# Patient Record
Sex: Male | Born: 1990 | Race: Black or African American | Hispanic: No | Marital: Single | Smoking: Never smoker
Health system: Southern US, Community
[De-identification: ages and names within clinical notes are randomized; demographics above are authoritative.]

## PROBLEM LIST (undated history)

## (undated) DIAGNOSIS — J45909 Unspecified asthma, uncomplicated: Secondary | ICD-10-CM

---
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
## 2011-01-03 ENCOUNTER — Emergency Department: Payer: Self-pay | Admitting: Unknown Physician Specialty

## 2014-02-15 ENCOUNTER — Emergency Department (HOSPITAL_COMMUNITY): Payer: No Typology Code available for payment source

## 2014-02-15 ENCOUNTER — Encounter (HOSPITAL_COMMUNITY): Payer: Self-pay | Admitting: *Deleted

## 2014-02-15 ENCOUNTER — Emergency Department (HOSPITAL_COMMUNITY)
Admission: EM | Admit: 2014-02-15 | Discharge: 2014-02-15 | Disposition: A | Discharge status: Home / Self Care | Payer: No Typology Code available for payment source | Attending: Emergency Medicine | Admitting: Emergency Medicine

## 2014-02-15 DIAGNOSIS — Z880 Allergy status to penicillin: Secondary | ICD-10-CM | POA: Insufficient documentation

## 2014-02-15 DIAGNOSIS — Y998 Other external cause status: Secondary | ICD-10-CM | POA: Insufficient documentation

## 2014-02-15 DIAGNOSIS — S59902A Unspecified injury of left elbow, initial encounter: Secondary | ICD-10-CM | POA: Insufficient documentation

## 2014-02-15 DIAGNOSIS — Y9389 Activity, other specified: Secondary | ICD-10-CM | POA: Insufficient documentation

## 2014-02-15 DIAGNOSIS — M25522 Pain in left elbow: Secondary | ICD-10-CM

## 2014-02-15 DIAGNOSIS — Z043 Encounter for examination and observation following other accident: Secondary | ICD-10-CM

## 2014-02-15 DIAGNOSIS — Z041 Encounter for examination and observation following transport accident: Secondary | ICD-10-CM

## 2014-02-15 DIAGNOSIS — Y92410 Unspecified street and highway as the place of occurrence of the external cause: Secondary | ICD-10-CM | POA: Insufficient documentation

## 2014-02-15 MED ORDER — IBUPROFEN 600 MG PO TABS: 600.0000 mg | ORAL_TABLET | Freq: Four times a day (QID) | ORAL | Status: AC | PRN

## 2014-02-15 MED ORDER — HYDROCODONE-ACETAMINOPHEN 5-325 MG PO TABS: 1.0000 | ORAL_TABLET | ORAL | Status: AC | PRN

## 2014-02-15 MED ORDER — IBUPROFEN 400 MG PO TABS
600.0000 mg | ORAL_TABLET | Freq: Once | ORAL | Status: AC
  Administered 2014-02-15: 600 mg via ORAL
  Filled 2014-02-15 (×2): qty 1

## 2014-02-15 MED ORDER — HYDROCODONE-ACETAMINOPHEN 5-325 MG PO TABS
2.0000 | ORAL_TABLET | Freq: Once | ORAL | Status: AC
  Administered 2014-02-15: 2 via ORAL
  Filled 2014-02-15: qty 2
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
## 2014-02-15 NOTE — ED Notes (Signed)
Declined W/C at D/C and was escorted to lobby by RN. 
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
## 2015-10-04 ENCOUNTER — Encounter (HOSPITAL_COMMUNITY): Payer: Self-pay

## 2015-10-04 ENCOUNTER — Emergency Department (HOSPITAL_COMMUNITY)
Admission: EM | Admit: 2015-10-04 | Discharge: 2015-10-04 | Disposition: A | Discharge status: Home / Self Care | Payer: No Typology Code available for payment source | Attending: Emergency Medicine | Admitting: Emergency Medicine

## 2015-10-04 ENCOUNTER — Emergency Department (HOSPITAL_COMMUNITY): Payer: No Typology Code available for payment source

## 2015-10-04 DIAGNOSIS — Y939 Activity, unspecified: Secondary | ICD-10-CM | POA: Insufficient documentation

## 2015-10-04 DIAGNOSIS — Y999 Unspecified external cause status: Secondary | ICD-10-CM | POA: Insufficient documentation

## 2015-10-04 DIAGNOSIS — J45909 Unspecified asthma, uncomplicated: Secondary | ICD-10-CM | POA: Insufficient documentation

## 2015-10-04 DIAGNOSIS — Z91010 Allergy to peanuts: Secondary | ICD-10-CM | POA: Diagnosis not present

## 2015-10-04 DIAGNOSIS — M25512 Pain in left shoulder: Secondary | ICD-10-CM | POA: Insufficient documentation

## 2015-10-04 DIAGNOSIS — Z5181 Encounter for therapeutic drug level monitoring: Secondary | ICD-10-CM | POA: Insufficient documentation

## 2015-10-04 DIAGNOSIS — R079 Chest pain, unspecified: Secondary | ICD-10-CM | POA: Diagnosis not present

## 2015-10-04 DIAGNOSIS — R109 Unspecified abdominal pain: Secondary | ICD-10-CM | POA: Diagnosis not present

## 2015-10-04 DIAGNOSIS — Y92410 Unspecified street and highway as the place of occurrence of the external cause: Secondary | ICD-10-CM | POA: Diagnosis not present

## 2015-10-04 DIAGNOSIS — Z79899 Other long term (current) drug therapy: Secondary | ICD-10-CM | POA: Diagnosis not present

## 2015-10-04 DIAGNOSIS — R55 Syncope and collapse: Secondary | ICD-10-CM | POA: Insufficient documentation

## 2015-10-04 HISTORY — DX: Unspecified asthma, uncomplicated: J45.909

## 2015-10-04 LAB — CBC
HCT: 41.6 % (ref 39.0–52.0)
Hemoglobin: 13.9 g/dL (ref 13.0–17.0)
MCH: 29.3 pg (ref 26.0–34.0)
MCHC: 33.4 g/dL (ref 30.0–36.0)
MCV: 87.6 fL (ref 78.0–100.0)
Platelets: 271 10*3/uL (ref 150–400)
RBC: 4.75 MIL/uL (ref 4.22–5.81)
RDW: 12.9 % (ref 11.5–15.5)
WBC: 6.9 10*3/uL (ref 4.0–10.5)

## 2015-10-04 LAB — COMPREHENSIVE METABOLIC PANEL
ALT: 15 U/L — ABNORMAL LOW (ref 17–63)
AST: 20 U/L (ref 15–41)
Albumin: 4.5 g/dL (ref 3.5–5.0)
Alkaline Phosphatase: 50 U/L (ref 38–126)
Anion gap: 8 (ref 5–15)
BILIRUBIN TOTAL: 1.4 mg/dL — AB (ref 0.3–1.2)
BUN: 11 mg/dL (ref 6–20)
CO2: 23 mmol/L (ref 22–32)
CREATININE: 0.88 mg/dL (ref 0.61–1.24)
Calcium: 9.5 mg/dL (ref 8.9–10.3)
Chloride: 108 mmol/L (ref 101–111)
GFR calc Af Amer: >60 mL/min (ref >60)
Glucose, Bld: 116 mg/dL — ABNORMAL HIGH (ref 65–99)
Potassium: 3.7 mmol/L (ref 3.5–5.1)
Sodium: 139 mmol/L (ref 135–145)
Total Protein: 7 g/dL (ref 6.5–8.1)

## 2015-10-04 LAB — PROTIME-INR
INR: 1.08
Prothrombin Time: 14 seconds (ref 11.4–15.2)

## 2015-10-04 LAB — ETHANOL: Alcohol, Ethyl (B): <5 mg/dL (ref <5)

## 2015-10-04 LAB — I-STAT CG4 LACTIC ACID, ED: LACTIC ACID, VENOUS: 1.43 mmol/L (ref 0.5–1.9)

## 2015-10-04 MED ORDER — HYDROMORPHONE HCL 1 MG/ML IJ SOLN
1.0000 mg | Freq: Once | INTRAMUSCULAR | Status: AC
  Administered 2015-10-04: 1 mg via INTRAVENOUS
  Filled 2015-10-04: qty 1

## 2015-10-04 MED ORDER — SODIUM CHLORIDE 0.9 % IV BOLUS (SEPSIS)
1000.0000 mL | Freq: Once | INTRAVENOUS | Status: AC
  Administered 2015-10-04: 1000 mL via INTRAVENOUS

## 2015-10-04 MED ORDER — IBUPROFEN 400 MG PO TABS
600.0000 mg | ORAL_TABLET | Freq: Once | ORAL | Status: AC
  Administered 2015-10-04: 600 mg via ORAL
  Filled 2015-10-04: qty 1

## 2015-10-04 MED ORDER — IOPAMIDOL (ISOVUE-300) INJECTION 61%
INTRAVENOUS | Status: AC
  Administered 2015-10-04: 100 mL
  Filled 2015-10-04: qty 100

## 2015-10-04 MED ORDER — SODIUM CHLORIDE 0.9 % IV SOLN: INTRAVENOUS | Status: DC

## 2015-10-04 NOTE — ED Notes (Signed)
Patient transported to CT 

## 2015-10-04 NOTE — ED Notes (Signed)
MD at bedside. 

## 2015-10-04 NOTE — Progress Notes (Signed)
Orthopedic Tech Progress Note Patient Details:  Kenneth Mcgee [birth year] [id number]  Ortho Devices Type of Ortho Device: Arm sling Ortho Device/Splint Location: lue Ortho Device/Splint Interventions: Ordered, Application   [name], [name] 10/04/2015, 8:25 PM

## 2015-10-04 NOTE — Discharge Instructions (Signed)
If you were given medicines take as directed.  If you are on coumadin or contraceptives realize their levels and effectiveness is altered by many different medicines.  If you have any reaction (rash, tongues swelling, other) to the medicines stop taking and see a physician.    If your blood pressure was elevated in the ER make sure you follow up for management with a primary doctor or return for chest pain, shortness of breath or stroke symptoms.  Please follow up as directed and return to the ER or see a physician for new or worsening symptoms.  Thank you. Vitals:   10/04/15 1738 10/04/15 1800 10/04/15 1830 10/04/15 2000  BP: 142/92 135/85 140/79   Pulse: 108 102 95 104  Resp: 24 21 21 17   Temp: 99.1 F (37.3 C)     TempSrc: Oral     SpO2: 97% 97% 96% 97%  Weight: 145 lb (65.8 kg)     Height: 5\' 9"  (1.753 m)
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
## 2015-10-05 LAB — SAMPLE TO BLOOD BANK
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
## 2018-04-12 ENCOUNTER — Other Ambulatory Visit: Payer: Self-pay | Admitting: Surgery

## 2018-04-12 DIAGNOSIS — R1909 Other intra-abdominal and pelvic swelling, mass and lump: Secondary | ICD-10-CM

## 2018-04-16 ENCOUNTER — Ambulatory Visit
Admission: RE | Admit: 2018-04-16 | Discharge: 2018-04-16 | Disposition: A | Discharge status: Home / Self Care | Payer: BC Managed Care – PPO | Source: Ambulatory Visit | Attending: Surgery | Admitting: Surgery

## 2018-04-16 ENCOUNTER — Ambulatory Visit: Payer: BC Managed Care – PPO

## 2018-04-16 ENCOUNTER — Other Ambulatory Visit: Payer: Self-pay

## 2018-04-16 DIAGNOSIS — R1909 Other intra-abdominal and pelvic swelling, mass and lump: Secondary | ICD-10-CM

## 2018-11-08 ENCOUNTER — Ambulatory Visit: Payer: Self-pay

## 2018-11-13 ENCOUNTER — Ambulatory Visit: Payer: Self-pay

## 2018-11-16 ENCOUNTER — Encounter: Payer: Self-pay | Admitting: Physician Assistant

## 2018-11-16 ENCOUNTER — Ambulatory Visit: Payer: Self-pay | Admitting: Physician Assistant

## 2018-11-16 ENCOUNTER — Other Ambulatory Visit: Payer: Self-pay

## 2018-11-16 DIAGNOSIS — Z113 Encounter for screening for infections with a predominantly sexual mode of transmission: Secondary | ICD-10-CM

## 2018-11-16 DIAGNOSIS — Z792 Long term (current) use of antibiotics: Secondary | ICD-10-CM

## 2018-11-16 LAB — GRAM STAIN

## 2018-11-16 MED ORDER — AZITHROMYCIN 500 MG PO TABS
1000.0000 mg | ORAL_TABLET | Freq: Once | ORAL | Status: AC
  Administered 2018-11-16: 1000 mg via ORAL

## 2018-11-16 NOTE — Progress Notes (Signed)
Gram stain reviewed with Antoine Primas, PA and is negative today. Pt received Azithromycin 1g po DOT per Antoine Primas, PA order as pt is having symptoms. Provider orders completed.Ronny Bacon, RN

## 2018-11-16 NOTE — Progress Notes (Signed)
    STI clinic/screening visit  Subjective:  Kenneth Mcgee is a 28 y.o. male being seen today for an STI screening visit. The patient reports they do have symptoms.  Patient has the following medical conditions:  There are no active problems to display for this patient.    Chief Complaint  Patient presents with  . SEXUALLY TRANSMITTED DISEASE    STD screening    HPI  Patient reports that he has had yellowish discharge from his penis for 3 days.  States that he has not had any discharge today so far.  Denies other symptoms.   See flowsheet for further details and programmatic requirements.    The following portions of the patient's history were reviewed and updated as appropriate: allergies, current medications, past medical history, past social history, past surgical history and problem list.  Objective:  There were no vitals filed for this visit.  Physical Exam Constitutional:      General: He is not in acute distress.    Appearance: Normal appearance.  HENT:     Head: Normocephalic and atraumatic.     Mouth/Throat:     Mouth: Mucous membranes are moist.     Pharynx: Oropharynx is clear. No oropharyngeal exudate or posterior oropharyngeal erythema.  Eyes:     Conjunctiva/sclera: Conjunctivae normal.  Neck:     Musculoskeletal: Neck supple.  Pulmonary:     Effort: Pulmonary effort is normal.  Abdominal:     Palpations: Abdomen is soft. There is no mass.     Tenderness: There is no abdominal tenderness. There is no guarding or rebound.  Genitourinary:    Penis: Normal.      Scrotum/Testes: Normal.     Comments: Pubic area without nits, lice, edema, erythema, lesions and inguinal adenopathy. Penis circumcised and without any discharge at meatus. Lymphadenopathy:     Cervical: No cervical adenopathy.  Skin:    General: Skin is warm and dry.     Findings: No bruising, erythema, lesion or rash.  Neurological:     Mental Status: He is alert and oriented to person,  place, and time.  Psychiatric:        Mood and Affect: Mood normal.        Behavior: Behavior normal.        Thought Content: Thought content normal.        Judgment: Judgment normal.       Assessment and Plan:  Kenneth Mcgee is a 28 y.o. male presenting to the [facility] Department for STI screening  1. Screening for STD (sexually transmitted disease) Patient with symptoms but normal exam and Gram stain. Rec condoms with all sex. Await test results.  Counseled that RN will call if needs to RTC for further treatment once results are back. - Gram stain - Gonococcus culture - HIV [place] [facility] - Syphilis Serology, [place]  2. Prophylactic antibiotic Will cover for NGU with Azithromycin 1 g po DOT due to patient symptoms. No sex for 7 days and until results are back. RTC if vomits < 2 hr after taking medicine for re-treatment.  - azithromycin (ZITHROMAX) tablet 1,000 mg     No follow-ups on file.  No future appointments.  Jerene Dilling, PA

## 2018-11-16 NOTE — Progress Notes (Signed)
Pt here for STD screening and desires HIV and syphillis blood work today.Ronny Bacon, RN

## 2018-11-21 LAB — GONOCOCCUS CULTURE

## 2018-12-17 ENCOUNTER — Encounter: Payer: Self-pay | Admitting: Physician Assistant

## 2018-12-17 ENCOUNTER — Ambulatory Visit: Payer: BC Managed Care – PPO | Admitting: Physician Assistant

## 2018-12-17 ENCOUNTER — Other Ambulatory Visit: Payer: Self-pay

## 2018-12-17 DIAGNOSIS — Z113 Encounter for screening for infections with a predominantly sexual mode of transmission: Secondary | ICD-10-CM

## 2018-12-17 DIAGNOSIS — Z792 Long term (current) use of antibiotics: Secondary | ICD-10-CM

## 2018-12-17 MED ORDER — METRONIDAZOLE 500 MG PO TABS: 2000.0000 mg | ORAL_TABLET | Freq: Once | ORAL | 0 refills | Status: AC

## 2018-12-17 NOTE — Progress Notes (Signed)
S:  Patient into clinic stating that he is still having symptoms.  Reports that he took the medicine that he was given at last visit without any problems and has not had sex but is still having penile discharge, mostly first am since about 1 week after last visit.  Denies changes to  History since last visit on 11/16/2018. O:  WDWN male in NAD, A&O x 3; results from 11/16/2018 visit reviewed and negative. A/P:  1.  NGU treated but patient with continued symptoms.   2.  Will cover for possible Trich as cause of symptoms with Metronidazole 500mg  #4 po stat with food, no EtOH for 24 hr before and until 72 hr after completing medicine. 3.  No sex for 7 days and until after partner is treated. 4.  Rec condoms with all sex. 5.  ROI signed and patient given copies of HIV and RPR results from last visit per his request. 5.  Counseled patient to RTC if symptoms continue after treatment with Metronidazole.

## 2018-12-17 NOTE — Progress Notes (Signed)
Pt states he is still having a "little discharge in mornings." He states he got two pills at his last visit, and he did not vomit up his medicine from last visit and has not had sex.

## 2019-12-27 ENCOUNTER — Ambulatory Visit: Payer: Self-pay | Admitting: Physician Assistant

## 2019-12-27 ENCOUNTER — Other Ambulatory Visit: Payer: Self-pay

## 2019-12-27 ENCOUNTER — Encounter: Payer: Self-pay | Admitting: Physician Assistant

## 2019-12-27 DIAGNOSIS — Z113 Encounter for screening for infections with a predominantly sexual mode of transmission: Secondary | ICD-10-CM

## 2019-12-27 LAB — GRAM STAIN

## 2019-12-27 NOTE — Progress Notes (Signed)
Pt is here for STD screening today.

## 2019-12-27 NOTE — Progress Notes (Signed)
Post:  Gram stain reviewed . No tx per S.O. Provider orders complete.   Harvie Heck, RN

## 2019-12-28 ENCOUNTER — Encounter: Payer: Self-pay | Admitting: Physician Assistant

## 2019-12-28 NOTE — Progress Notes (Signed)
   [facility] Department STI clinic/screening visit  Subjective:  Kenneth Mcgee is a 29 y.o. male being seen today for an STI screening visit. The patient reports they do not have symptoms.    Patient has the following medical conditions:  There are no problems to display for this patient.    Chief Complaint  Patient presents with  . SEXUALLY TRANSMITTED DISEASE    screening    HPI  Patient reports that he is not having any symptoms but would like a screening today.  Denies chronic conditions, surgeries and regular medicines today.  Reports that last HIV test was last year and last void prior to sample collection was about 2 hr ago.   See flowsheet for further details and programmatic requirements.    The following portions of the patient's history were reviewed and updated as appropriate: allergies, current medications, past medical history, past social history, past surgical history and problem list.  Objective:  There were no vitals filed for this visit.  Physical Exam Constitutional:      General: He is not in acute distress.    Appearance: Normal appearance.  HENT:     Head: Normocephalic and atraumatic.     Comments: No nits,lice, or hair loss. No cervical, supraclavicular or axillary adenopathy.    Mouth/Throat:     Mouth: Mucous membranes are moist.     Pharynx: Oropharynx is clear. No oropharyngeal exudate or posterior oropharyngeal erythema.  Eyes:     Conjunctiva/sclera: Conjunctivae normal.  Pulmonary:     Effort: Pulmonary effort is normal.  Abdominal:     Palpations: Abdomen is soft. There is no mass.     Tenderness: There is no abdominal tenderness. There is no guarding or rebound.  Genitourinary:    Penis: Normal.      Testes: Normal.     Comments: Pubic area without nits, lice, hair loss, edema, erythema, lesions and inguinal adenopathy. Penis circumcised without rash, lesions and discharge at meatus. Musculoskeletal:     Cervical  back: Neck supple. No tenderness.  Skin:    General: Skin is warm and dry.     Findings: No bruising, erythema, lesion or rash.  Neurological:     Mental Status: He is alert and oriented to person, place, and time.  Psychiatric:        Mood and Affect: Mood normal.        Behavior: Behavior normal.        Thought Content: Thought content normal.        Judgment: Judgment normal.       Assessment and Plan:  Kenneth Mcgee is a 29 y.o. male presenting to the [facility] Department for STI screening  1. Screening for STD (sexually transmitted disease) Patient into clinic without symptoms. Rec condoms with all sex. Await test results.  Counseled that RN will call if needs to RTC for treatment once results are back. - Gram stain - Gonococcus culture - HIV [place] [facility] - Syphilis Serology, [place] [facility]     No follow-ups on file.  No future appointments.  Matt Holmes, PA

## 2020-01-01 LAB — GONOCOCCUS CULTURE

## 2020-02-04 IMAGING — US US PELVIS LIMITED
1 series · 14 of 25 positions shown · non-contrast
Comparison: None.

CLINICAL DATA: Painful lump in left groin, initial encounter

EXAM:
LIMITED ULTRASOUND OF PELVIS
TECHNIQUE: Limited transabdominal ultrasound examination of the pelvis was
performed.

[Series 1: us pelvis limited · 0.11mm/px · 33 acquisitions, 14 frames shown]
[im 1/33]
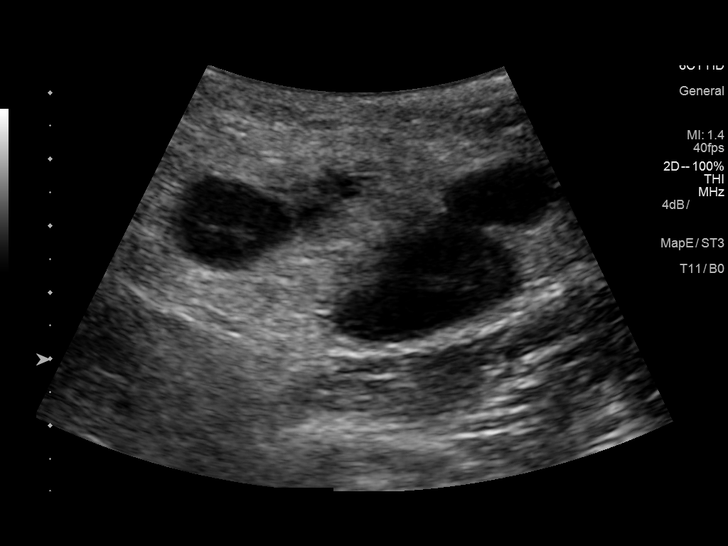
[im 3/33]
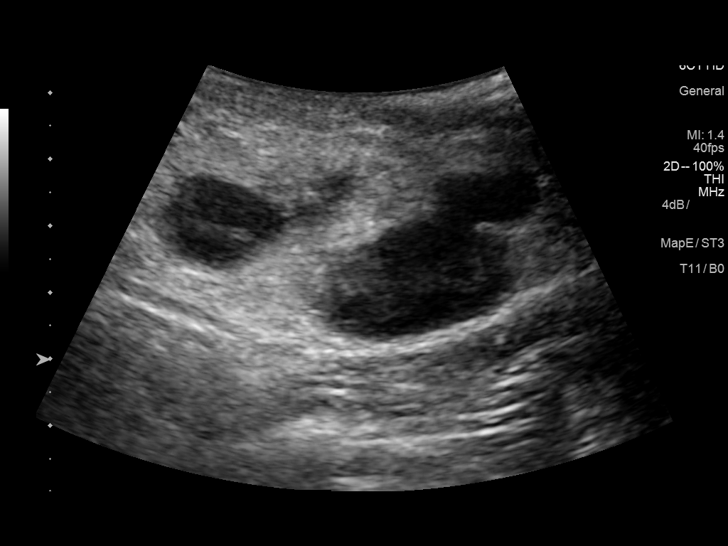
[im 6/33]
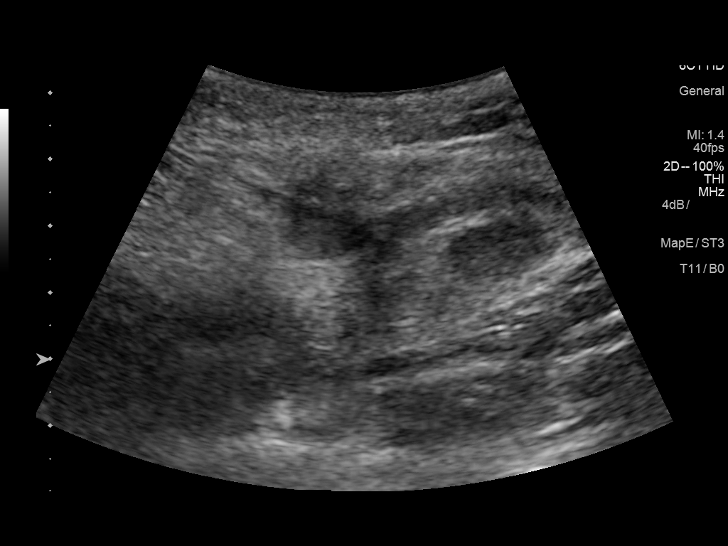
[im 9/33]
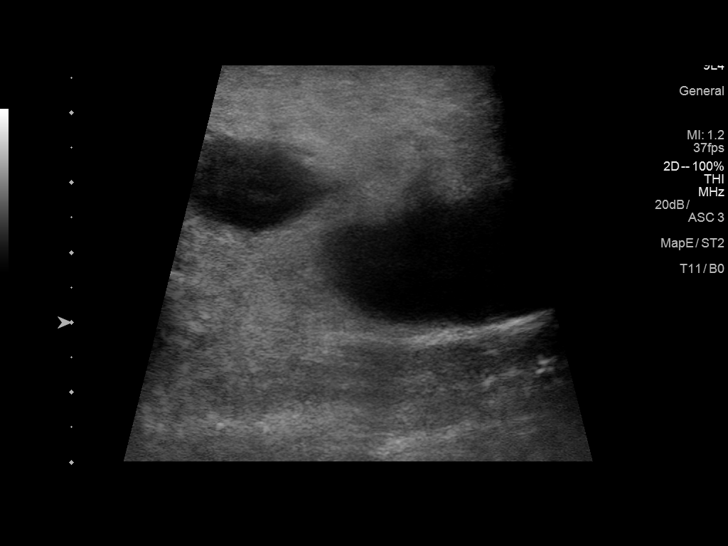
[im 11/33]
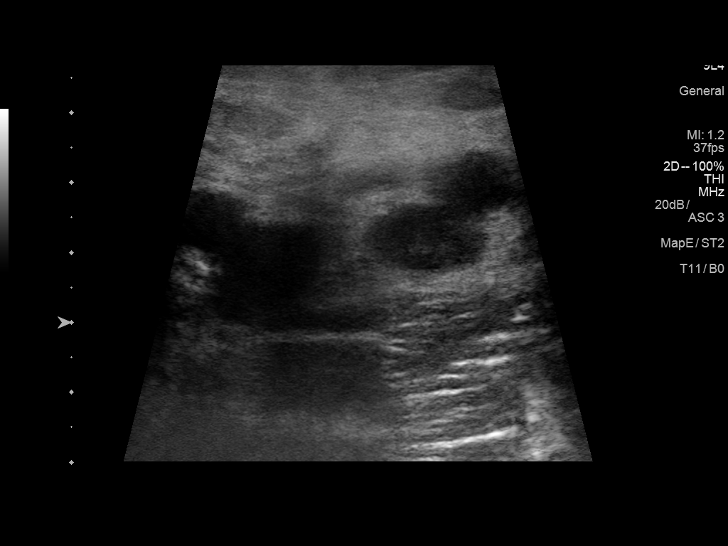
[im 13/33]
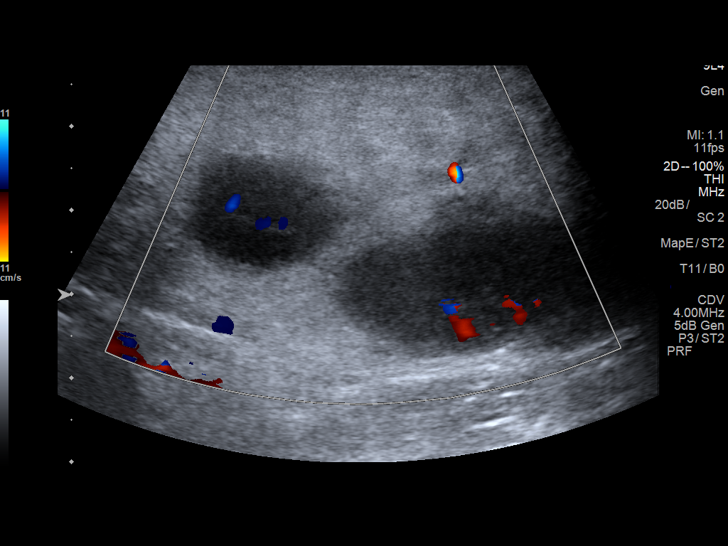
[im 15/33]
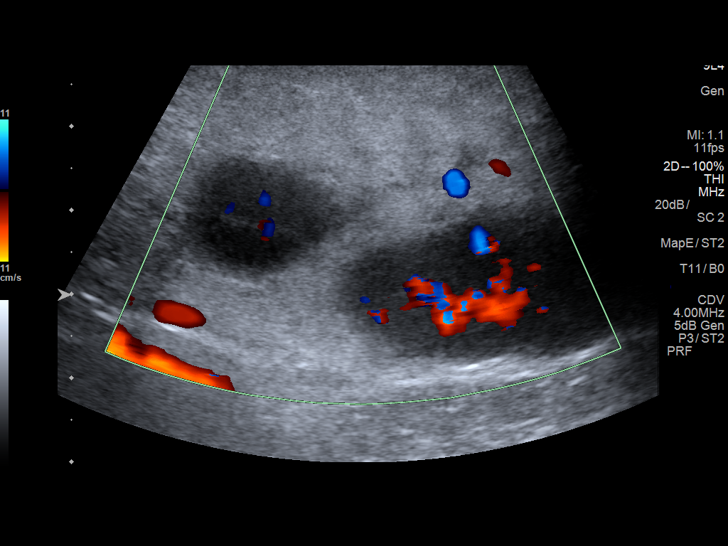
[im 18/33]
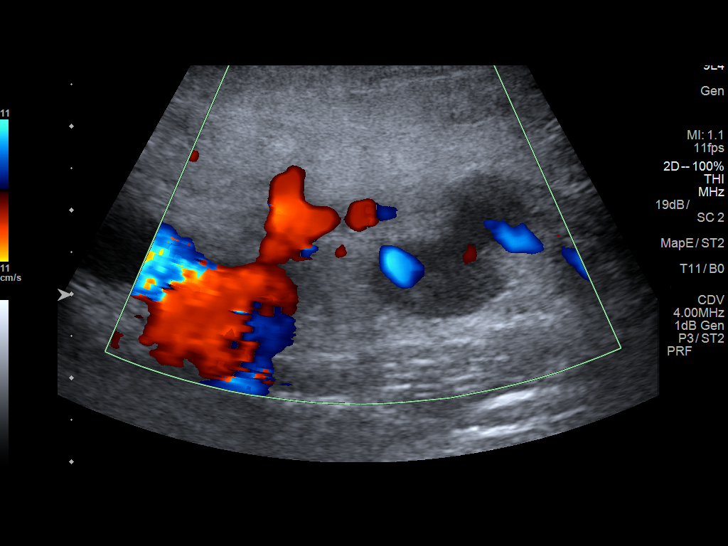
[im 21/33]
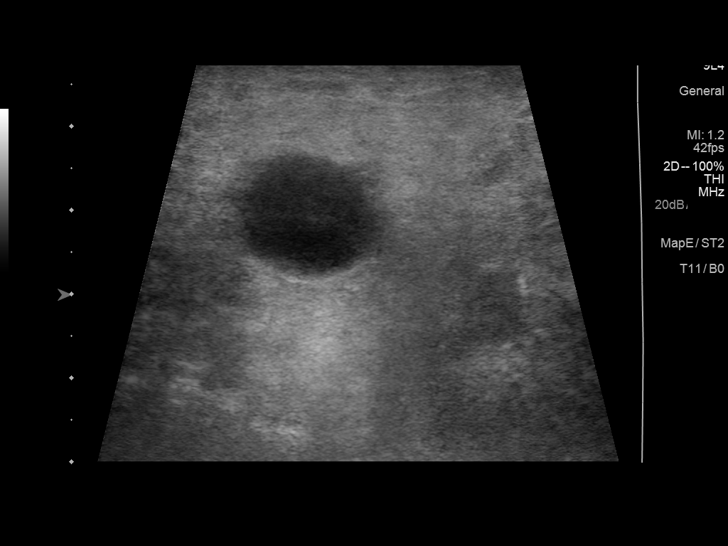
[im 22/33]
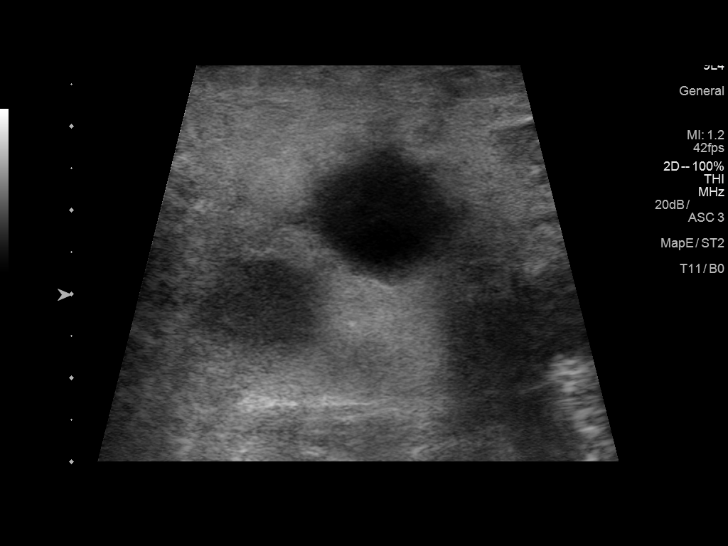
[im 25/33]
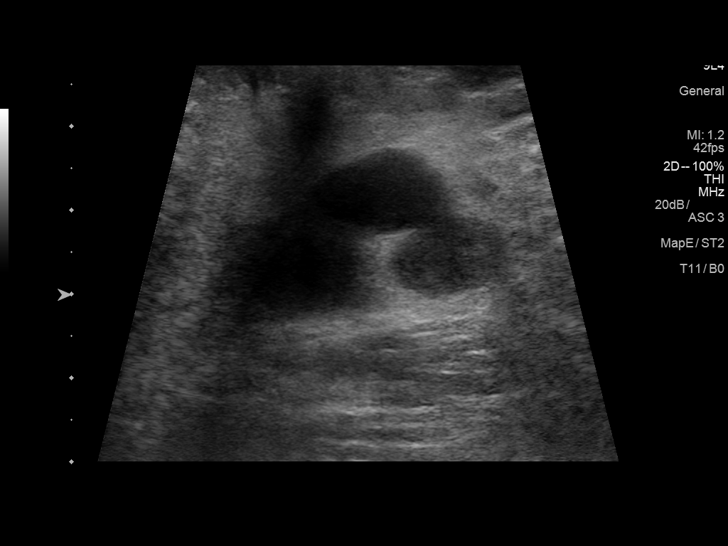
[im 27/33]
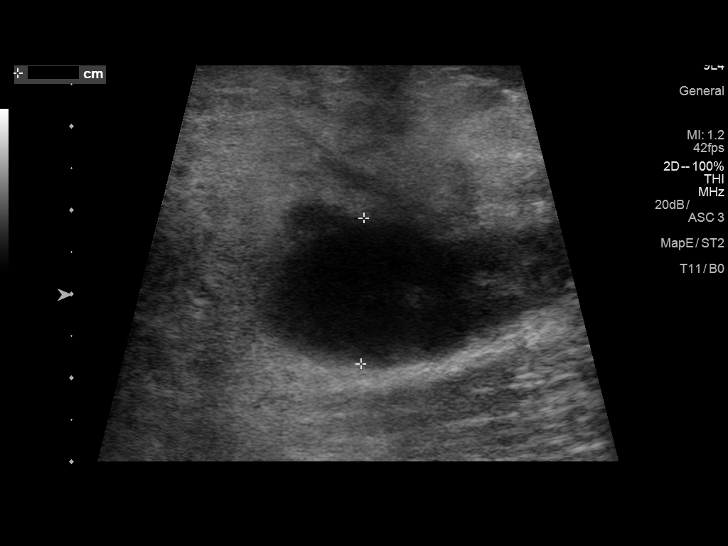
[im 30/33]
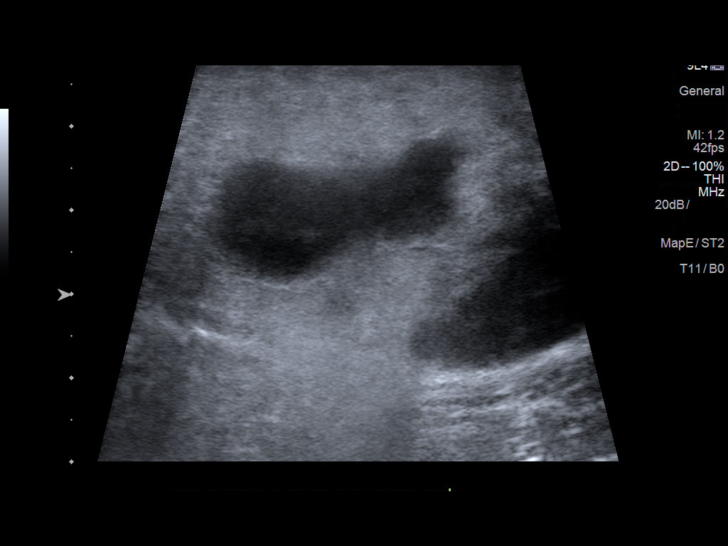
[im 33/33]
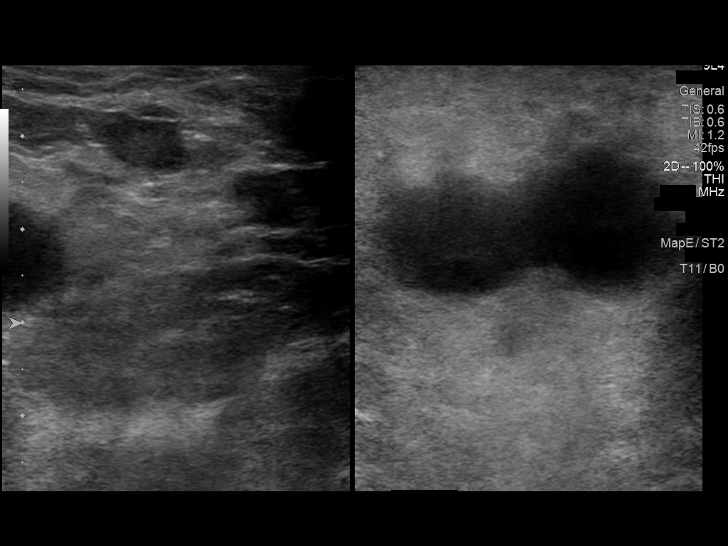

[14 of 25 positions shown; findings below may reference images not displayed]

FINDINGS: Sonographic evaluation in the area of clinical concern reveals
multiple prominent lymph nodes. Largest of these measures
approximately 1.7 cm in short axis.
IMPRESSION: Multiple prominent lymph nodes. Given the patient's age these are
likely reactive in nature. Correlation with any peripheral
inflammatory change is recommended. By history the patient states
these have improved on antibiotic therapy. Clinical follow-up is
recommended.

## 2020-04-16 ENCOUNTER — Ambulatory Visit: Payer: Self-pay | Admitting: Family Medicine

## 2020-04-16 ENCOUNTER — Other Ambulatory Visit: Payer: Self-pay

## 2020-04-16 DIAGNOSIS — Z113 Encounter for screening for infections with a predominantly sexual mode of transmission: Secondary | ICD-10-CM

## 2020-04-16 LAB — GRAM STAIN

## 2020-04-16 NOTE — Progress Notes (Signed)
   [facility] Department STI clinic/screening visit  Subjective:  Kenneth Mcgee is a 29 y.o. male being seen today for an STI screening visit. The patient reports they do not have symptoms.    Patient has the following medical conditions:  There are no problems to display for this patient.    Chief Complaint  Patient presents with  . SEXUALLY TRANSMITTED DISEASE    Screening     HPI  Patient reports here for screening.  Denies s/sx    See flowsheet for further details and programmatic requirements.    The following portions of the patient's history were reviewed and updated as appropriate: allergies, current medications, past medical history, past social history, past surgical history and problem list.  Objective:  There were no vitals filed for this visit.  Physical Exam Constitutional:      Appearance: Normal appearance.  HENT:     Head: Normocephalic.     Mouth/Throat:     Mouth: Mucous membranes are moist.     Pharynx: Oropharynx is clear. No oropharyngeal exudate.  Genitourinary:    Comments: No lice, nits, or pest, no lesions or odor discharge.  Denies pain or tenderness with paplation of testicles.  No lesions, ulcers or masses present.    Musculoskeletal:     Cervical back: Normal range of motion.  Lymphadenopathy:     Cervical: No cervical adenopathy.  Skin:    General: Skin is warm and dry.     Findings: No bruising, erythema, lesion or rash.  Neurological:     Mental Status: He is alert.  Psychiatric:        Mood and Affect: Mood normal.        Behavior: Behavior normal.       Assessment and Plan:  Kenneth Mcgee is a 30 y.o. male presenting to the [facility] Department for STI screening  1. Screening examination for venereal disease  - Gonococcus culture - Gram stain - HIV [place] [facility] - Syphilis Serology, [facility]  Patient does not have STI symptoms Patient accepted all screenings including , urethral  GC and bloodwork for HIV/RPR.  Patient meets criteria for HepB screening? No. Ordered? No - does not meet critea  Patient meets criteria for HepC screening? No. Ordered? No - does not meet critera  Recommended condom use with all sex Discussed importance of condom use for STI prevent  Gram stain negative no treatment needed  Discussed time line for [facility] results and that patient will be called with positive results and encouraged patient to call if he had not heard in 2 weeks Recommended returning for continued or worsening symptoms.     Return for as needed.  No future appointments.  Wendi Snipes, FNP

## 2020-04-21 LAB — GONOCOCCUS CULTURE

## 2020-06-15 ENCOUNTER — Ambulatory Visit: Payer: Self-pay | Admitting: Physician Assistant

## 2020-06-15 DIAGNOSIS — Z113 Encounter for screening for infections with a predominantly sexual mode of transmission: Secondary | ICD-10-CM

## 2020-06-15 LAB — GRAM STAIN

## 2020-06-16 ENCOUNTER — Encounter: Payer: Self-pay | Admitting: Physician Assistant

## 2020-06-16 NOTE — Progress Notes (Signed)
   [facility] Department STI clinic/screening visit  Subjective:  Kenneth Mcgee is a 30 y.o. male being seen today for an STI screening visit. The patient reports they do not have symptoms.    Patient has the following medical conditions:  There are no problems to display for this patient.    Chief Complaint  Patient presents with  . SEXUALLY TRANSMITTED DISEASE    screening    HPI  Patient reports that he is not having any symptoms but would like a screening today.  Denies surgeries and reports a history of asthma.  States that he is taking Vitamins D and B12 as well as phentermine for weight loss.  State last HIV test was in March of this year and last void prior to sample collection for Gram stain was 1.5 hr ago.   See flowsheet for further details and programmatic requirements.    The following portions of the patient's history were reviewed and updated as appropriate: allergies, current medications, past medical history, past social history, past surgical history and problem list.  Objective:  There were no vitals filed for this visit.  Physical Exam Constitutional:      General: He is not in acute distress.    Appearance: Normal appearance.  HENT:     Head: Normocephalic and atraumatic.     Comments: No nits,lice, or hair loss. No cervical, supraclavicular or axillary adenopathy.    Mouth/Throat:     Mouth: Mucous membranes are moist.     Pharynx: Oropharynx is clear. No oropharyngeal exudate or posterior oropharyngeal erythema.  Eyes:     Conjunctiva/sclera: Conjunctivae normal.  Pulmonary:     Effort: Pulmonary effort is normal.  Abdominal:     Palpations: Abdomen is soft. There is no mass.     Tenderness: There is no abdominal tenderness. There is no guarding or rebound.  Genitourinary:    Penis: Normal.      Testes: Normal.     Comments: Pubic area without nits, lice, hair loss, edema, erythema, lesions and inguinal adenopathy. Penis  circumcised without rash, lesions and discharge at meatus. Testicles descended bilaterally,nt, no masses or edema. Musculoskeletal:     Cervical back: Neck supple. No tenderness.  Skin:    General: Skin is warm and dry.     Findings: No bruising, erythema, lesion or rash.     Comments: Multiple tattoos.  Neurological:     Mental Status: He is alert and oriented to person, place, and time.  Psychiatric:        Mood and Affect: Mood normal.        Behavior: Behavior normal.        Thought Content: Thought content normal.        Judgment: Judgment normal.       Assessment and Plan:  Kenneth Mcgee is a 30 y.o. male presenting to the [facility] Department for STI screening  1. Screening for STD (sexually transmitted disease) Patient into clinic without symptoms. Reviewed with patient that Gram stain is normal and no treatment is indicated today. Rec condoms with all sex. Await test results.  Counseled that RN will call if needs to RTC for treatment once results are back. - Gram stain - Gonococcus culture - HIV [place] [facility] - Syphilis Serology,  Lab     No follow-ups on file.  No future appointments.  Matt Holmes, PA

## 2020-06-19 LAB — GONOCOCCUS CULTURE

## 2020-09-02 ENCOUNTER — Ambulatory Visit: Payer: Commercial Managed Care - PPO | Admitting: Physician Assistant

## 2020-09-02 ENCOUNTER — Other Ambulatory Visit: Payer: Self-pay

## 2020-09-02 DIAGNOSIS — Z202 Contact with and (suspected) exposure to infections with a predominantly sexual mode of transmission: Secondary | ICD-10-CM

## 2020-09-02 DIAGNOSIS — Z113 Encounter for screening for infections with a predominantly sexual mode of transmission: Secondary | ICD-10-CM

## 2020-09-02 LAB — GRAM STAIN

## 2020-09-02 MED ORDER — AZITHROMYCIN 500 MG PO TABS
2000.0000 mg | ORAL_TABLET | Freq: Once | ORAL | Status: AC
  Administered 2020-09-02: 2000 mg via ORAL

## 2020-09-02 MED ORDER — GENTAMICIN SULFATE 40 MG/ML IJ SOLN
240.0000 mg | Freq: Once | INTRAMUSCULAR | Status: AC
  Administered 2020-09-02: 240 mg via INTRAMUSCULAR

## 2020-09-02 NOTE — Progress Notes (Signed)
Pt here for STD screening.  Gram stain results reviewed.  Medication given and Gentamicin 240 mg given IM without any complications.  Pt given condoms. Berdie Ogren, RN

## 2020-09-03 ENCOUNTER — Encounter: Payer: Self-pay | Admitting: Physician Assistant

## 2020-09-03 NOTE — Progress Notes (Signed)
[facility] Department STI clinic/screening visit  Subjective:  Kenneth Mcgee is a 30 y.o. male being seen today for an STI screening visit. The patient reports they do not have symptoms.    Patient has the following medical conditions:  There are no problems to display for this patient.    Chief Complaint  Patient presents with   SEXUALLY TRANSMITTED DISEASE    Screening     HPI  Patient reports that he was sent an anonymous text that stated he is a contact to [facility].  Denies symptoms but does state he may have noticed a small amount of discharge yesterday.  Denies chronic conditions, reports history of asthma but states he has no problems with that now.  States last HIV test was 2 months ago and last void prior to sample collection for Gram stain was about 2 hr ago.   See flowsheet for further details and programmatic requirements.    The following portions of the patient's history were reviewed and updated as appropriate: allergies, current medications, past medical history, past social history, past surgical history and problem list.  Objective:  There were no vitals filed for this visit.  Physical Exam Constitutional:      General: He is not in acute distress.    Appearance: Normal appearance.  HENT:     Head: Normocephalic and atraumatic.     Comments: No nits,lice, or hair loss. No cervical, supraclavicular or axillary adenopathy.     Mouth/Throat:     Mouth: Mucous membranes are moist.     Pharynx: Oropharynx is clear. No oropharyngeal exudate or posterior oropharyngeal erythema.  Eyes:     Conjunctiva/sclera: Conjunctivae normal.  Pulmonary:     Effort: Pulmonary effort is normal.  Abdominal:     Palpations: Abdomen is soft. There is no mass.     Tenderness: There is no abdominal tenderness. There is no guarding or rebound.  Genitourinary:    Penis: Normal.      Testes: Normal.     Comments: Pubic area without nits, lice, hair loss, edema,  erythema, lesions and inguinal adenopathy. Penis circumcised without rash, lesions and discharge at meatus. Testicles descended bilaterally,nt, no masses or edema.  Musculoskeletal:     Cervical back: Neck supple. No tenderness.  Skin:    General: Skin is warm and dry.     Findings: No bruising, erythema, lesion or rash.     Comments: Multiple tattoos.  Neurological:     Mental Status: He is alert and oriented to person, place, and time.  Psychiatric:        Mood and Affect: Mood normal.        Behavior: Behavior normal.        Thought Content: Thought content normal.        Judgment: Judgment normal.      Assessment and Plan:  Kenneth Mcgee is a 30 y.o. male presenting to the [facility] Department for STI screening  1. Screening for STD (sexually transmitted disease) Patient into clinic without symptoms. Reviewed Gram stain results. Rec condoms with all sex. Await test results.  Counseled that RN will call if needs to RTC for treatment once results are back.  - Gram stain - Gonococcus culture - HIV [place] [facility] - Syphilis Serology, [place] [facility]  2. Gonorrhea contact Will treat as a contact to [facility] with Gentamicin 240 mg IM and Azithromycin 2 g po DOT today. No sex for 14 days and  until after partner completes treatment. Call with questions or concerns. - gentamicin (GARAMYCIN) injection 240 mg - azithromycin (ZITHROMAX) tablet 2,000 mg     No follow-ups on file.  No future appointments.  Matt Holmes, PA

## 2020-09-06 LAB — GONOCOCCUS CULTURE

## 2020-11-02 ENCOUNTER — Ambulatory Visit: Payer: Self-pay | Admitting: Physician Assistant

## 2020-11-02 ENCOUNTER — Encounter: Payer: Self-pay | Admitting: Physician Assistant

## 2020-11-02 ENCOUNTER — Other Ambulatory Visit: Payer: Self-pay

## 2020-11-02 DIAGNOSIS — Z299 Encounter for prophylactic measures, unspecified: Secondary | ICD-10-CM

## 2020-11-02 DIAGNOSIS — Z113 Encounter for screening for infections with a predominantly sexual mode of transmission: Secondary | ICD-10-CM

## 2020-11-02 LAB — HM HIV SCREENING LAB: HM HIV Screening: NEGATIVE

## 2020-11-02 LAB — GRAM STAIN

## 2020-11-02 MED ORDER — DOXYCYCLINE HYCLATE 100 MG PO TABS: 100.0000 mg | ORAL_TABLET | Freq: Two times a day (BID) | ORAL | 0 refills | Status: AC

## 2020-11-02 MED ORDER — METRONIDAZOLE 500 MG PO TABS: 2000.0000 mg | ORAL_TABLET | Freq: Once | ORAL | 0 refills | Status: AC

## 2020-11-02 NOTE — Progress Notes (Signed)
Patient here for STD screening. Gram Stain reviewed by provider. Doxycycline and Metronidazole given to patient. No further questions or concerns at this time.

## 2020-11-02 NOTE — Progress Notes (Signed)
[facility] Department STI clinic/screening visit  Subjective:  Kenneth Mcgee is a 30 y.o. male being seen today for an STI screening visit. The patient reports they do have symptoms.    Patient has the following medical conditions:  There are no problems to display for this patient.    Chief Complaint  Patient presents with   SEXUALLY TRANSMITTED DISEASE    HPI  Patient reports that he "feels funny" when urinating for about 2 weeks.  Denies other symptoms, chronic conditions, surgeries and regular medicines.  States last HIV test was in August and last void prior to sample collection for Gram stain was less than 2 hr ago.  Screening for MPX risk: Does the patient have an unexplained rash? No Is the patient MSM? No Does the patient endorse multiple sex partners or anonymous sex partners? No Did the patient have close or sexual contact with a person diagnosed with MPX? No Has the patient traveled outside the [place] where MPX is endemic? No Is there a high clinical suspicion for MPX-- evidenced by one of the following No  -Unlikely to be chickenpox  -Lymphadenopathy  -Rash that present in same phase of evolution on any given body part   See flowsheet for further details and programmatic requirements.    The following portions of the patient's history were reviewed and updated as appropriate: allergies, current medications, past medical history, past social history, past surgical history and problem list.  Objective:  There were no vitals filed for this visit.  Physical Exam Constitutional:      General: He is not in acute distress.    Appearance: Normal appearance.  HENT:     Head: Normocephalic and atraumatic.     Comments: No nits,lice, or hair loss. No cervical, supraclavicular or axillary adenopathy.     Mouth/Throat:     Mouth: Mucous membranes are moist.     Pharynx: Oropharynx is clear. No oropharyngeal exudate or posterior oropharyngeal erythema.   Eyes:     Conjunctiva/sclera: Conjunctivae normal.  Pulmonary:     Effort: Pulmonary effort is normal.  Abdominal:     Palpations: Abdomen is soft. There is no mass.     Tenderness: There is no abdominal tenderness. There is no guarding or rebound.  Genitourinary:    Penis: Normal.      Testes: Normal.     Comments: Pubic area without nits, lice, hair loss, edema, erythema, lesions and inguinal adenopathy. Penis circumcised without rash, lesions and discharge at meatus. Testicles descended bilaterally,nt, no masses or edema.  Musculoskeletal:     Cervical back: Neck supple. No tenderness.  Skin:    General: Skin is warm and dry.     Findings: No bruising, erythema, lesion or rash.  Neurological:     Mental Status: He is alert and oriented to person, place, and time.  Psychiatric:        Mood and Affect: Mood normal.        Behavior: Behavior normal.        Thought Content: Thought content normal.        Judgment: Judgment normal.      Assessment and Plan:  Kenneth Mcgee is a 30 y.o. male presenting to the [facility] Department for STI screening  1. Screening for STD (sexually transmitted disease) Patient into clinic with symptoms. Reviewed Gram stain results.  Rec condoms with all sex. Await test results.  Counseled that RN will call if needs to RTC  for treatment once results are back.  - Gram stain - Gonococcus culture - HIV [place] [facility] - Syphilis Serology, [place] [facility]  2. Prophylactic measure Due to patient symptoms will cover for NGU with Doxycycline 100 mg #14 1 po BID for 7 days and Metronidazole 2 g po at one time with food, no EtOH for 24 hr before and until 72 hr after completing medicine.  No sex for 14 days and until after partner completes treatment. Call with questions or concerns.  - doxycycline (VIBRA-TABS) 100 MG tablet; Take 1 tablet (100 mg total) by mouth 2 (two) times daily for 7 days.  Dispense: 14 tablet; Refill: 0 - metroNIDAZOLE  (FLAGYL) 500 MG tablet; Take 4 tablets (2,000 mg total) by mouth once for 1 dose.  Dispense: 4 tablet; Refill: 0     No follow-ups on file.  No future appointments.  Matt Holmes, PA

## 2020-11-07 LAB — GONOCOCCUS CULTURE

## 2020-12-07 ENCOUNTER — Ambulatory Visit: Payer: Commercial Managed Care - PPO

## 2021-01-20 ENCOUNTER — Ambulatory Visit: Payer: Self-pay | Admitting: Family Medicine

## 2021-01-20 ENCOUNTER — Other Ambulatory Visit: Payer: Self-pay

## 2021-01-20 DIAGNOSIS — Z113 Encounter for screening for infections with a predominantly sexual mode of transmission: Secondary | ICD-10-CM

## 2021-01-20 LAB — GRAM STAIN

## 2021-01-20 NOTE — Progress Notes (Signed)
[facility] Department STI clinic/screening visit  Subjective:  Kenneth Mcgee is a 30 y.o. male being seen today for an STI screening visit. The patient reports they do not have symptoms.    Patient has the following medical conditions:  There are no problems to display for this patient.    Chief Complaint  Patient presents with   SEXUALLY TRANSMITTED DISEASE    Screening     HPI  Patient reports here for screening, denies s/sx   Does the patient or their partner desires a pregnancy in the next year? No  Screening for MPX risk: Does the patient have an unexplained rash? No Is the patient MSM? No Does the patient endorse multiple sex partners or anonymous sex partners? No Did the patient have close or sexual contact with a person diagnosed with MPX? No Has the patient traveled outside the [place] where MPX is endemic? No Is there a high clinical suspicion for MPX-- evidenced by one of the following No  -Unlikely to be chickenpox  -Lymphadenopathy  -Rash that present in same phase of evolution on any given body part   See flowsheet for further details and programmatic requirements.    The following portions of the patient's history were reviewed and updated as appropriate: allergies, current medications, past medical history, past social history, past surgical history and problem list.  Objective:  There were no vitals filed for this visit.  Physical Exam Constitutional:      Appearance: Normal appearance.  HENT:     Head: Normocephalic and atraumatic.     Mouth/Throat:     Mouth: Mucous membranes are moist.     Pharynx: Oropharynx is clear. No oropharyngeal exudate.  Pulmonary:     Effort: Pulmonary effort is normal.  Genitourinary:    Penis: Normal.      Testes: Normal.     Comments: No lice, nits, or pest, no lesions or odor discharge.  Denies pain or tenderness with paplation of testicles.  No lesions, ulcers or masses present.    Musculoskeletal:      Cervical back: Normal range of motion.  Lymphadenopathy:     Cervical: No cervical adenopathy.  Skin:    General: Skin is warm and dry.     Findings: No bruising, erythema, lesion or rash.  Neurological:     General: No focal deficit present.     Mental Status: He is alert and oriented to person, place, and time.  Psychiatric:        Mood and Affect: Mood normal.        Behavior: Behavior normal.      Assessment and Plan:  WENDALL ISABELL is a 30 y.o. male presenting to the [facility] Department for STI screening  1. Screening examination for venereal disease Patient does not have STI symptoms Patient accepted all screenings including  gram stain, urethral  GC and bloodwork for HIV/RPR.  Patient meets criteria for HepB screening? No. Ordered? No - does not meet criteria  Patient meets criteria for HepC screening? No. Ordered? No - does not meet criteria  Recommended condom use with all sex Discussed importance of condom use for STI prevent  Treat gram stain per standing order Discussed time line for State Lab results and that patient will be called with positive results and encouraged patient to call if he had not heard in 2 weeks Recommended returning for continued or worsening symptoms.  - Gonococcus culture - Gram stain - HIV [place]  [facility] - Syphilis Serology,  Lab  Return for as needed.  No future appointments.  Wendi Snipes, FNP

## 2021-01-24 LAB — GONOCOCCUS CULTURE

## 2021-08-30 ENCOUNTER — Ambulatory Visit: Payer: Commercial Managed Care - PPO

## 2021-08-30 ENCOUNTER — Ambulatory Visit: Payer: Self-pay | Admitting: Nurse Practitioner

## 2021-08-30 ENCOUNTER — Encounter: Payer: Self-pay | Admitting: Nurse Practitioner

## 2021-08-30 DIAGNOSIS — J45909 Unspecified asthma, uncomplicated: Secondary | ICD-10-CM | POA: Insufficient documentation

## 2021-08-30 DIAGNOSIS — Z113 Encounter for screening for infections with a predominantly sexual mode of transmission: Secondary | ICD-10-CM

## 2021-08-30 LAB — HM HIV SCREENING LAB: HM HIV Screening: NEGATIVE

## 2021-08-30 LAB — GRAM STAIN

## 2021-08-30 NOTE — Progress Notes (Signed)
Pt here for STI screening.  Gram stain results reviewed.  No treatment needed.  Condoms provided.-Collins Scotland, RN

## 2021-08-30 NOTE — Progress Notes (Signed)
[facility] Department STI clinic/screening visit  Subjective:  Kenneth Mcgee is a 31 y.o. male being seen today for an STI screening visit. The patient reports they do have symptoms.    Patient has the following medical conditions:   Patient Active Problem List   Diagnosis Date Noted   Asthma 08/30/2021     Chief Complaint  Patient presents with   SEXUALLY TRANSMITTED DISEASE    Screening urinating frequently      HPI  Patient reports to clinic for an STD screening.  Patient reports that for a couple of weeks he has had some frequency with urination.    Does the patient or their partner desires a pregnancy in the next year? No  Screening for MPX risk: Does the patient have an unexplained rash? No Is the patient MSM? No Does the patient endorse multiple sex partners or anonymous sex partners? No Did the patient have close or sexual contact with a person diagnosed with MPX? No Has the patient traveled outside the [place] where MPX is endemic? No Is there a high clinical suspicion for MPX-- evidenced by one of the following No  -Unlikely to be chickenpox  -Lymphadenopathy  -Rash that present in same phase of evolution on any given body part   See flowsheet for further details and programmatic requirements.   Immunization History  Administered Date(s) Administered   HPV Quadrivalent 06/24/2008, 04/23/2014, 08/26/2014   Influenza-Unspecified 11/17/2020   PFIZER(Purple Top)SARS-COV-2 Vaccination 02/27/2019, 03/20/2019   Tdap 02/26/2013     The following portions of the patient's history were reviewed and updated as appropriate: allergies, current medications, past medical history, past social history, past surgical history and problem list.  Objective:  There were no vitals filed for this visit.  Physical Exam Constitutional:      Appearance: Normal appearance.  HENT:     Head: Normocephalic. No abrasion, masses or laceration. Hair is normal.     Right  Ear: External ear normal.     Left Ear: External ear normal.     Nose: Nose normal.     Mouth/Throat:     Mouth: No oral lesions.     Dentition: No dental caries.     Pharynx: No pharyngeal swelling, oropharyngeal exudate, posterior oropharyngeal erythema or uvula swelling.     Tonsils: No tonsillar exudate or tonsillar abscesses.  Eyes:     General: Lids are normal.        Right eye: No discharge.        Left eye: No discharge.     Conjunctiva/sclera: Conjunctivae normal.     Right eye: No exudate.    Left eye: No exudate. Abdominal:     General: Abdomen is flat.     Palpations: Abdomen is soft.     Tenderness: There is no abdominal tenderness. There is no rebound.  Genitourinary:    Pubic Area: No rash or pubic lice.      Penis: Normal and circumcised. No erythema or discharge.      Testes: Normal.        Right: Mass or tenderness not present.        Left: Mass or tenderness not present.     Rectum: Normal.     Comments: Discharge amount: None Color: None  Musculoskeletal:     Cervical back: Full passive range of motion without pain, normal range of motion and neck supple.  Lymphadenopathy:     Cervical: No cervical adenopathy.     Right  cervical: No superficial, deep or posterior cervical adenopathy.    Left cervical: No superficial, deep or posterior cervical adenopathy.     Upper Body:     Right upper body: No supraclavicular, axillary or epitrochlear adenopathy.     Left upper body: No supraclavicular, axillary or epitrochlear adenopathy.     Lower Body: No right inguinal adenopathy. No left inguinal adenopathy.  Skin:    General: Skin is warm and dry.     Findings: No lesion or rash.  Neurological:     Mental Status: He is alert and oriented to person, place, and time.  Psychiatric:        Attention and Perception: Attention normal.        Mood and Affect: Mood normal.        Speech: Speech normal.        Behavior: Behavior normal. Behavior is cooperative.        Assessment and Plan:  Kenneth Mcgee is a 31 y.o. male presenting to the [facility] Department for STI screening  1. Screening examination for venereal disease -31 year old male in clinic today for STD screening. -Patient does have STI symptoms Patient accepted all screenings including urine CT/GC and bloodwork for HIV/RPR.  Patient meets criteria for HepB screening? No. Ordered? No - low risk Patient meets criteria for HepC screening? No. Ordered? No - low risk Recommended condom use with all sex Discussed importance of condom use for STI prevent  Treat gram stain per standing order Discussed time line for State Lab results and that patient will be called with positive results and encouraged patient to call if he had not heard in 2 weeks Recommended returning for continued or worsening symptoms.    - Gram stain - Chlamydia/GC NAA, Confirmation - HIV [place] [facility] - Syphilis Serology, [facility]  Total time spent: 20 minutes    Return if symptoms worsen or fail to improve.    Glenna Fellows, FNP

## 2021-09-02 LAB — CHLAMYDIA/GC NAA, CONFIRMATION
Chlamydia trachomatis, NAA: NEGATIVE
Neisseria gonorrhoeae, NAA: NEGATIVE

## 2021-12-14 ENCOUNTER — Ambulatory Visit: Payer: Self-pay | Admitting: Family Medicine

## 2021-12-14 DIAGNOSIS — Z202 Contact with and (suspected) exposure to infections with a predominantly sexual mode of transmission: Secondary | ICD-10-CM

## 2021-12-14 MED ORDER — DOXYCYCLINE HYCLATE 100 MG PO TABS: 100.0000 mg | ORAL_TABLET | Freq: Two times a day (BID) | ORAL | 0 refills | Status: AC

## 2021-12-14 NOTE — Progress Notes (Signed)
[facility] Department STI clinic/screening visit  Subjective:  Kenneth Mcgee is a 31 y.o. male being seen today for an STI screening visit. The patient reports they do not have symptoms.    Patient has the following medical conditions:   Patient Active Problem List   Diagnosis Date Noted   Asthma 08/30/2021     Chief Complaint  Patient presents with   SEXUALLY TRANSMITTED DISEASE    Exposed to Chlamydia. No symptoms    HPI  Patient reports recent exposure to Chlamydia positive partner. Declines testing or exam today- would like empiric treatment.  Last HIV test per patient/review of record was  Lab Results  Component Value Date   HMHIVSCREEN Negative - Validated 08/30/2021   No results found for: "HIV"   See flowsheet for further details and programmatic requirements.   Immunization History  Administered Date(s) Administered   HPV Quadrivalent 06/24/2008, 04/23/2014, 08/26/2014   Influenza-Unspecified 11/17/2020   PFIZER(Purple Top)SARS-COV-2 Vaccination 02/27/2019, 03/20/2019   Tdap 02/26/2013     The following portions of the patient's history were reviewed and updated as appropriate: allergies, current medications, past medical history, past social history, past surgical history and problem list.  Objective:  There were no vitals filed for this visit.  Physical Exam  Declined physical exam.   Assessment and Plan:  Kenneth Mcgee is a 31 y.o. male presenting to the [facility] Department for STI screening  There are no diagnoses linked to this encounter.  Problem List Items Addressed This Visit   None Visit Diagnoses     Chlamydia contact    -  Primary   Relevant Medications   doxycycline (VIBRA-TABS) 100 MG tablet        Return if symptoms worsen or fail to improve.  No future appointments.  Sharlet Salina, FNP  Attestation of Attending Supervision of Advanced Practitioner (CNM/PA/NP): Evaluation and management  procedures were performed by the Advanced Practice Provider under my supervision and collaboration.  I have reviewed the Advanced Practice Provider's note and chart, and I agree with the management and plan. I have also made any necessary editorial changes.  Caren Macadam, MD, MPH, [place] Department

## 2021-12-14 NOTE — Progress Notes (Signed)
Pt appointment for STI exposure. Pt informed had been exposed to Chlamydia. Pt declined exam and screenings requesting just treatment. Seen by provider to confirm. Treated for Chlamydia per standing orders.

## 2022-01-28 ENCOUNTER — Ambulatory Visit: Payer: Commercial Managed Care - PPO

## 2022-02-08 ENCOUNTER — Encounter: Payer: Self-pay | Admitting: Family

## 2022-02-08 ENCOUNTER — Ambulatory Visit: Payer: Self-pay | Admitting: Family

## 2022-02-08 DIAGNOSIS — Z113 Encounter for screening for infections with a predominantly sexual mode of transmission: Secondary | ICD-10-CM

## 2022-02-08 LAB — HM HIV SCREENING LAB: HM HIV Screening: NEGATIVE

## 2022-02-08 NOTE — Progress Notes (Signed)
Pt here for STI screening.  No in-house labs performed.  Condoms provided to [employer], RN

## 2022-02-10 LAB — CHLAMYDIA/GC NAA, CONFIRMATION
Chlamydia trachomatis, NAA: NEGATIVE
Neisseria gonorrhoeae, NAA: NEGATIVE

## 2022-02-10 NOTE — Progress Notes (Signed)
[facility] Department STI clinic/screening visit  Subjective:  Kenneth Mcgee is a 32 y.o. male being seen today for an STI screening visit. The patient reports they do not have symptoms.    Patient has the following medical conditions:   Patient Active Problem List   Diagnosis Date Noted   Asthma 08/30/2021     Chief Complaint  Patient presents with   SEXUALLY TRANSMITTED DISEASE    Screening    HPI  Patient reports no STD symptoms  Last HIV test per patient/review of record was  Lab Results  Component Value Date   HMHIVSCREEN Negative - Validated 08/30/2021   No results found for: "HIV"  Does the patient or their partner desires a pregnancy in the next year? No  Screening for MPX risk: Does the patient have an unexplained rash? No Is the patient MSM? No Does the patient endorse multiple sex partners or anonymous sex partners? Yes Did the patient have close or sexual contact with a person diagnosed with MPX? No Has the patient traveled outside the [place] where MPX is endemic? No Is there a high clinical suspicion for MPX-- evidenced by one of the following No  -Unlikely to be chickenpox  -Lymphadenopathy  -Rash that present in same phase of evolution on any given body part   See flowsheet for further details and programmatic requirements.   Immunization History  Administered Date(s) Administered   HPV Quadrivalent 06/24/2008, 04/23/2014, 08/26/2014   Influenza-Unspecified 11/17/2020   PFIZER(Purple Top)SARS-COV-2 Vaccination 02/27/2019, 03/20/2019   Tdap 02/26/2013     The following portions of the patient's history were reviewed and updated as appropriate: allergies, current medications, past medical history, past social history, past surgical history and problem list.  Objective:  There were no vitals filed for this visit.  Physical Exam  Physical Examination deferred by patient.   Assessment and Plan:  Kenneth Mcgee is a 32 y.o. male  presenting to the [facility] for STI screening  1. Screening for venereal disease  - HIV [place] [facility] - Syphilis Serology, [place] [facility] - Chlamydia/GC NAA, Confirmation  Will contact with any positive test results Given condoms to use for all sex   Patient does not have STI symptoms Patient accepted all screenings including   Patient meets criteria for HepB screening? No. Ordered? no Patient meets criteria for HepC screening? No. Ordered? no Recommended condom use with all sex Discussed importance of condom use for STI prevent  Treat gram stain per standing order Discussed time line for State Lab results and that patient will be called with positive results and encouraged patient to call if he had not heard in 2 weeks Recommended returning for continued or worsening symptoms.   Return if symptoms worsen or fail to improve, for testing as needed.  No future appointments.  Marline Backbone, FNP

## 2022-02-14 ENCOUNTER — Ambulatory Visit: Payer: Medicaid Other

## 2022-02-28 ENCOUNTER — Ambulatory Visit: Payer: Medicaid Other | Admitting: Nurse Practitioner

## 2022-02-28 ENCOUNTER — Encounter: Payer: Self-pay | Admitting: Nurse Practitioner

## 2022-02-28 DIAGNOSIS — N341 Nonspecific urethritis: Secondary | ICD-10-CM

## 2022-02-28 DIAGNOSIS — Z113 Encounter for screening for infections with a predominantly sexual mode of transmission: Secondary | ICD-10-CM | POA: Diagnosis not present

## 2022-02-28 DIAGNOSIS — A549 Gonococcal infection, unspecified: Secondary | ICD-10-CM

## 2022-02-28 LAB — GRAM STAIN

## 2022-02-28 MED ORDER — CEFTRIAXONE SODIUM 500 MG IJ SOLR
500.0000 mg | Freq: Once | INTRAMUSCULAR | Status: AC
  Administered 2022-02-28: 500 mg via INTRAMUSCULAR

## 2022-02-28 MED ORDER — DOXYCYCLINE HYCLATE 100 MG PO TABS: 100.0000 mg | ORAL_TABLET | Freq: Two times a day (BID) | ORAL | 0 refills | Status: AC

## 2022-02-28 NOTE — Progress Notes (Signed)
[facility] Department STI clinic/screening visit  Subjective:  Kenneth Mcgee is a 32 y.o. male being seen today for an STI screening visit. The patient reports they do have symptoms.    Patient has the following medical conditions:   Patient Active Problem List   Diagnosis Date Noted   Asthma 08/30/2021     Chief Complaint  Patient presents with   SEXUALLY TRANSMITTED DISEASE    Screening     HPI  Patient reports to clinic today for STD screening.  Patient reports discharge and dysuria that began 02/21/22.  Last HIV test per patient/review of record was  Lab Results  Component Value Date   HMHIVSCREEN Negative - Validated 02/08/2022    Does the patient or their partner desires a pregnancy in the next year? No  Screening for MPX risk: Does the patient have an unexplained rash? No Is the patient MSM? No Does the patient endorse multiple sex partners or anonymous sex partners? Yes Did the patient have close or sexual contact with a person diagnosed with MPX? No Has the patient traveled outside the [place] where MPX is endemic? No Is there a high clinical suspicion for MPX-- evidenced by one of the following No  -Unlikely to be chickenpox  -Lymphadenopathy  -Rash that present in same phase of evolution on any given body part   See flowsheet for further details and programmatic requirements.   Immunization History  Administered Date(s) Administered   HPV Quadrivalent 06/24/2008, 04/23/2014, 08/26/2014   Influenza-Unspecified 11/17/2020   PFIZER(Purple Top)SARS-COV-2 Vaccination 02/27/2019, 03/20/2019   Tdap 02/26/2013     The following portions of the patient's history were reviewed and updated as appropriate: allergies, current medications, past medical history, past social history, past surgical history and problem list.  Objective:  There were no vitals filed for this visit.  Physical Exam Constitutional:      Appearance: Normal appearance.  HENT:      Head: Normocephalic. No abrasion, masses or laceration. Hair is normal.     Right Ear: External ear normal.     Left Ear: External ear normal.     Nose: Nose normal.     Mouth/Throat:     Lips: Pink.     Mouth: Mucous membranes are moist. No oral lesions.     Pharynx: No pharyngeal swelling, oropharyngeal exudate, posterior oropharyngeal erythema or uvula swelling.     Tonsils: No tonsillar exudate or tonsillar abscesses.  Eyes:     General: Lids are normal.        Right eye: No discharge.        Left eye: No discharge.     Conjunctiva/sclera: Conjunctivae normal.     Right eye: No exudate.    Left eye: No exudate. Pulmonary:     Effort: Pulmonary effort is normal.  Abdominal:     General: Abdomen is flat.     Palpations: Abdomen is soft.     Tenderness: There is no abdominal tenderness. There is no rebound.  Genitourinary:    Pubic Area: No rash or pubic lice.      Penis: Normal and circumcised. No erythema or discharge.      Testes: Normal.        Right: Mass or tenderness not present.        Left: Mass or tenderness not present.     Rectum: Normal.     Comments: Discharge amount: small Color: yellow/greenish  Musculoskeletal:     Cervical back: Full passive range  of motion without pain, normal range of motion and neck supple.  Lymphadenopathy:     Cervical: No cervical adenopathy.     Right cervical: No superficial, deep or posterior cervical adenopathy.    Left cervical: No superficial, deep or posterior cervical adenopathy.     Upper Body:     Right upper body: No supraclavicular, axillary or epitrochlear adenopathy.     Left upper body: No supraclavicular, axillary or epitrochlear adenopathy.     Lower Body: No right inguinal adenopathy. No left inguinal adenopathy.  Skin:    General: Skin is warm and dry.     Findings: No lesion or rash.  Neurological:     Mental Status: He is alert and oriented to person, place, and time.  Psychiatric:        Attention and  Perception: Attention normal.        Mood and Affect: Mood normal.        Speech: Speech normal.        Behavior: Behavior normal. Behavior is cooperative.       Assessment and Plan:  Kenneth Mcgee is a 32 y.o. male presenting to the [facility] for STI screening  1. Screening examination for venereal disease -32 year old male in clinic today for STD screening. -Patient does have STI symptoms Patient accepted all screenings including urine CT/GC, gram stain and declines bloodwork for HIV/RPR.  Patient meets criteria for HepB screening? No. Ordered? No - low risk  Patient meets criteria for HepC screening? No. Ordered? No - low risk  Recommended condom use with all sex Discussed importance of condom use for STI prevent  Treat gram stain per standing order Discussed time line for State Lab results and that patient will be called with positive results and encouraged patient to call if he had not heard in 2 weeks Recommended returning for continued or worsening symptoms.    - Gram stain - Chlamydia/GC NAA, Confirmation  2. NGU (nongonococcal urethritis) -Treat patient today for NGU.  Advised no sex for 7 days and 7 days after partner is treated.   - doxycycline (VIBRA-TABS) 100 MG tablet; Take 1 tablet (100 mg total) by mouth 2 (two) times daily for 7 days.  Dispense: 14 tablet; Refill: 0  3. Gonorrhea -Treat patient today for Gonorrhea.  Advised no sex for 7 days and 7 days after partner is treated.   - cefTRIAXone (ROCEPHIN) injection 500 mg     Total time spent: 20 minutes   Return if symptoms worsen or fail to improve.    Gregary Cromer, FNP

## 2022-02-28 NOTE — Progress Notes (Signed)
Patient here for STI screening.   Gram stain reviewed. Treated in clinic for NGU and gonorrhea per SO.   Education given and questions answered.   Report card filled out for NGU and gonorrhea.   Contact card given to patient.   Al Decant, RN

## 2022-03-04 LAB — CHLAMYDIA/GC NAA, CONFIRMATION
Chlamydia trachomatis, NAA: NEGATIVE
Neisseria gonorrhoeae, NAA: POSITIVE — AB

## 2022-03-04 LAB — N. GONORRHOEAE NAA, CONFIRM: N. gonorrhoeae NAA, Confirm: POSITIVE — AB

## 2022-03-16 ENCOUNTER — Telehealth: Payer: Self-pay | Admitting: Family Medicine

## 2022-03-16 NOTE — Telephone Encounter (Signed)
Pt states that he finished his medication, but vomited twice, after taking his medication.  Informed pt that he did not need another prescription and he could be retested 4 weeks after his last dose of antibiotic.  Windle Guard, RN

## 2022-03-16 NOTE — Telephone Encounter (Signed)
Patient states that he threw up one of the pills that he was given for STI, then went out of town. He wants to know if he needs to come in a get medication again. Please call.

## 2022-05-26 ENCOUNTER — Ambulatory Visit: Payer: Medicaid Other

## 2022-06-01 ENCOUNTER — Encounter: Payer: Self-pay | Admitting: Family Medicine

## 2022-06-01 ENCOUNTER — Ambulatory Visit: Payer: Medicaid Other | Admitting: Family Medicine

## 2022-06-01 DIAGNOSIS — Z113 Encounter for screening for infections with a predominantly sexual mode of transmission: Secondary | ICD-10-CM

## 2022-06-01 LAB — HM HIV SCREENING LAB: HM HIV Screening: NEGATIVE

## 2022-06-01 LAB — GRAM STAIN

## 2022-06-01 NOTE — Progress Notes (Signed)
Pt is here for STD screening.  Gram stain results reviewed, no treatment required.  Condoms given.  Berdie Ogren, RN

## 2022-06-01 NOTE — Progress Notes (Signed)
[facility] Department STI clinic/screening visit  Subjective:  Kenneth Mcgee is a 32 y.o. male being seen today for an STI screening visit. The patient reports they do have symptoms.    Patient has the following medical conditions:   Patient Active Problem List   Diagnosis Date Noted   Asthma 08/30/2021     Chief Complaint  Patient presents with   SEXUALLY TRANSMITTED DISEASE    Screening.  Pt states that he is "urinating a lot"    HPI  Patient reports that he has been feeling like "something is off" for about 1-2 weeks. Reports increased urination.   Last HIV test per patient/review of record was  Lab Results  Component Value Date   HMHIVSCREEN Negative - Validated 02/08/2022   No results found for: "HIV"  Does the patient or their partner desires a pregnancy in the next year? No  Screening for MPX risk: Does the patient have an unexplained rash? No Is the patient MSM? No Does the patient endorse multiple sex partners or anonymous sex partners? No Did the patient have close or sexual contact with a person diagnosed with MPX? No Has the patient traveled outside the [place] where MPX is endemic? No Is there a high clinical suspicion for MPX-- evidenced by one of the following No  -Unlikely to be chickenpox  -Lymphadenopathy  -Rash that present in same phase of evolution on any given body part   See flowsheet for further details and programmatic requirements.   Immunization History  Administered Date(s) Administered   HPV Quadrivalent 06/24/2008, 04/23/2014, 08/26/2014   Influenza-Unspecified 11/17/2020   PFIZER(Purple Top)SARS-COV-2 Vaccination 02/27/2019, 03/20/2019   Tdap 02/26/2013     The following portions of the patient's history were reviewed and updated as appropriate: allergies, current medications, past medical history, past social history, past surgical history and problem list.  Objective:  There were no vitals filed for this  visit.  Physical Exam Constitutional:      Appearance: Normal appearance.  HENT:     Head: Normocephalic and atraumatic.     Comments: No nits or hair loss    Mouth/Throat:     Mouth: Mucous membranes are moist. No oral lesions.     Pharynx: Oropharynx is clear. No oropharyngeal exudate or posterior oropharyngeal erythema.  Eyes:     General:        Right eye: No discharge.        Left eye: No discharge.     Conjunctiva/sclera:     Right eye: Right conjunctiva is not injected. No exudate.    Left eye: Left conjunctiva is not injected. No exudate. Pulmonary:     Effort: Pulmonary effort is normal.  Abdominal:     General: Abdomen is flat.     Palpations: Abdomen is soft. There is no hepatomegaly or mass.     Tenderness: There is no abdominal tenderness. There is no rebound.     Hernia: There is no hernia in the left inguinal area or right inguinal area.  Genitourinary:    Pubic Area: No rash or pubic lice (no nits).      Penis: Normal. No tenderness, discharge, swelling or lesions.      Testes: Normal.     Epididymis:     Right: Normal. No mass or tenderness.     Left: Normal. No mass or tenderness.     Rectum: Normal. No tenderness (no lesions or discharge).     Comments: Penile Discharge Amount: none Color:  none Lymphadenopathy:     Head:     Right side of head: No preauricular or posterior auricular adenopathy.     Left side of head: No preauricular or posterior auricular adenopathy.     Cervical: No cervical adenopathy.     Upper Body:     Right upper body: No supraclavicular, axillary or epitrochlear adenopathy.     Left upper body: No supraclavicular, axillary or epitrochlear adenopathy.     Lower Body: No right inguinal adenopathy. No left inguinal adenopathy.  Skin:    General: Skin is warm and dry.     Findings: No lesion or rash.  Neurological:     Mental Status: He is alert and oriented to person, place, and time.    Assessment and Plan:  Kenneth Mcgee is a 32 y.o. male presenting to the [facility] Department for STI screening  1. Screening for venereal disease  - HIV [place] [facility] - Syphilis Serology,  Lab - Chlamydia/GC NAA, Confirmation - Gram stain   Patient does have STI symptoms Patient accepted all screenings including  urine GC/Chlamydia, and blood work for HIV/Syphilis. Patient meets criteria for HepB screening? No. Ordered? not applicable Patient meets criteria for HepC screening? No. Ordered? not applicable Recommended condom use with all sex Discussed importance of condom use for STI prevent  Treat positive test results per standing order. Discussed time line for State Lab results and that patient will be called with positive results and encouraged patient to call if he had not heard in 2 weeks Recommended repeat testing in 3 months with positive results. Recommended returning for continued or worsening symptoms.   Return if symptoms worsen or fail to improve, for STI screening.  No future appointments. Total time spent 20 minutes Lenice Llamas, [place]

## 2022-06-04 LAB — CHLAMYDIA/GC NAA, CONFIRMATION
Chlamydia trachomatis, NAA: NEGATIVE
Neisseria gonorrhoeae, NAA: NEGATIVE

## 2022-08-29 ENCOUNTER — Ambulatory Visit: Payer: Medicaid Other | Admitting: Family Medicine

## 2022-08-29 DIAGNOSIS — Z113 Encounter for screening for infections with a predominantly sexual mode of transmission: Secondary | ICD-10-CM

## 2022-08-29 DIAGNOSIS — Z114 Encounter for screening for human immunodeficiency virus [HIV]: Secondary | ICD-10-CM

## 2022-08-29 DIAGNOSIS — Z708 Other sex counseling: Secondary | ICD-10-CM

## 2022-08-29 LAB — HM HIV SCREENING LAB: HM HIV Screening: NEGATIVE

## 2022-08-29 NOTE — Progress Notes (Signed)
[facility] Department STI clinic/screening visit  Subjective:  Kenneth Mcgee is a 32 y.o. male being seen today for an [facility] The patient reports they do not have symptoms.    Patient has the following medical conditions:   Patient Active Problem List   Diagnosis Date Noted   Asthma 08/30/2021    Chief Complaint  Patient presents with   SEXUALLY TRANSMITTED DISEASE    No symptoms     Last HIV test per patient/review of record was  Lab Results  Component Value Date   HMHIVSCREEN Negative - Validated 06/01/2022   No results found for: "HIV"  Does the patient or their partner desires a pregnancy in the next year? No  Screening for MPX risk: Does the patient have an unexplained rash? No Is the patient MSM? No Does the patient endorse multiple sex partners or anonymous sex partners? No Did the patient have close or sexual contact with a person diagnosed with MPX? No Has the patient traveled outside the [place] where MPX is endemic? No Is there a high clinical suspicion for MPX-- evidenced by one of the following No  -Unlikely to be chickenpox  -Lymphadenopathy  -Rash that present in same phase of evolution on any given body part   See flowsheet for further details and programmatic requirements.   Immunization History  Administered Date(s) Administered   HPV Quadrivalent 06/24/2008, 04/23/2014, 08/26/2014   Influenza-Unspecified 11/17/2020   PFIZER(Purple Top)SARS-COV-2 Vaccination 02/27/2019, 03/20/2019   Tdap 02/26/2013     The following portions of the patient's history were reviewed and updated as appropriate: allergies, current medications, past medical history, past social history, past surgical history and problem list.  Objective:  There were no vitals filed for this visit.  Patient seen by RN only. Self collected samples.  Assessment and Plan:  Kenneth Mcgee is a 32 y.o. male presenting to the [facility] Department for STI screening in [facility].  1. Screening examination for venereal disease  - Chlamydia/GC NAA, Confirmation - HIV [place] [facility] - Syphilis Serology, [place] [facility]  2. Counseling for sexually transmitted diseases   3. Screening for HIV (human immunodeficiency virus)    Patient does not have STI symptoms Patient accepted all screenings including  urine GC/Chlamydia, and blood work for HIV/Syphilis. Patient meets criteria for HepB screening? No. Ordered? not applicable Patient meets criteria for HepC screening? No. Ordered? not applicable Recommended condom use with all sex Discussed importance of condom use for STI prevent  Treat positive test results per standing order. Discussed time line for State Lab results and that patient will be called with positive results and encouraged patient to call if he had not heard in 2 weeks Recommended repeat testing in 3 months with positive results.  No follow-ups on file.  No future appointments.  Gaspar Garbe, RN

## 2022-09-01 LAB — CHLAMYDIA/GC NAA, CONFIRMATION
Chlamydia trachomatis, NAA: NEGATIVE
Neisseria gonorrhoeae, NAA: NEGATIVE

## 2022-09-01 NOTE — Progress Notes (Signed)
Attestation of [facility]: Evaluation and management procedures were performed by the Registered Nurse under [facility] standing orders.  RN independently saw the patient and provided screenings for them without medical exam. Patient self collected specimens. I have reviewed the RN's note and chart, and I agree with screening ordered.   Helena Middleton, FNP  

## 2024-02-23 ENCOUNTER — Ambulatory Visit: Payer: Self-pay
